# Patient Record
Sex: Female | Born: 1950 | Race: White | Hispanic: No | Marital: Married | Smoking: Never smoker
Health system: Southern US, Community
[De-identification: ages and names within clinical notes are randomized; demographics above are authoritative.]

## PROBLEM LIST (undated history)

## (undated) DIAGNOSIS — I10 Essential (primary) hypertension: Secondary | ICD-10-CM

## (undated) DIAGNOSIS — E039 Hypothyroidism, unspecified: Secondary | ICD-10-CM

## (undated) DIAGNOSIS — R000 Tachycardia, unspecified: Secondary | ICD-10-CM

## (undated) DIAGNOSIS — M8580 Other specified disorders of bone density and structure, unspecified site: Secondary | ICD-10-CM

## (undated) DIAGNOSIS — E559 Vitamin D deficiency, unspecified: Secondary | ICD-10-CM

## (undated) DIAGNOSIS — M1990 Unspecified osteoarthritis, unspecified site: Secondary | ICD-10-CM

## (undated) DIAGNOSIS — R51 Headache: Secondary | ICD-10-CM

## (undated) DIAGNOSIS — N8502 Endometrial intraepithelial neoplasia [EIN]: Secondary | ICD-10-CM

## (undated) DIAGNOSIS — I7300 Raynaud's syndrome without gangrene: Secondary | ICD-10-CM

## (undated) DIAGNOSIS — K219 Gastro-esophageal reflux disease without esophagitis: Secondary | ICD-10-CM

## (undated) DIAGNOSIS — I38 Endocarditis, valve unspecified: Secondary | ICD-10-CM

## (undated) DIAGNOSIS — N950 Postmenopausal bleeding: Secondary | ICD-10-CM

## (undated) DIAGNOSIS — B029 Zoster without complications: Secondary | ICD-10-CM

## (undated) HISTORY — PX: DILATION AND CURETTAGE OF UTERUS: SHX78

## (undated) HISTORY — DX: Postmenopausal bleeding: N95.0

## (undated) HISTORY — DX: Endometrial intraepithelial neoplasia (EIN): N85.02

## (undated) HISTORY — DX: Raynaud's syndrome without gangrene: I73.00

## (undated) HISTORY — PX: CHOLECYSTECTOMY: SHX55

## (undated) HISTORY — DX: Zoster without complications: B02.9

## (undated) HISTORY — DX: Tachycardia, unspecified: R00.0

## (undated) HISTORY — DX: Other specified disorders of bone density and structure, unspecified site: M85.80

## (undated) HISTORY — DX: Vitamin D deficiency, unspecified: E55.9

## (undated) HISTORY — PX: APPENDECTOMY: SHX54

---

## 2012-12-15 ENCOUNTER — Telehealth: Payer: Self-pay | Admitting: Gynecologic Oncology

## 2012-12-15 ENCOUNTER — Encounter: Payer: Self-pay | Admitting: Gynecologic Oncology

## 2012-12-16 ENCOUNTER — Ambulatory Visit: Payer: BC Managed Care – PPO | Attending: Gynecologic Oncology | Admitting: Gynecologic Oncology

## 2012-12-16 ENCOUNTER — Encounter: Payer: Self-pay | Admitting: Gynecologic Oncology

## 2012-12-16 VITALS — BP 126/88 | HR 68 | Temp 97.7°F | Resp 16 | Ht 63.19 in | Wt 192.6 lb

## 2012-12-16 DIAGNOSIS — N8501 Benign endometrial hyperplasia: Secondary | ICD-10-CM

## 2012-12-16 DIAGNOSIS — N8502 Endometrial intraepithelial neoplasia [EIN]: Secondary | ICD-10-CM | POA: Insufficient documentation

## 2012-12-16 NOTE — Progress Notes (Signed)
Consult Note: Gyn-Onc  Consult was requested by Dr. Lamona Curl for the evaluation of Theresa Oliver 62 y.o. female  CC:  Chief Complaint  Patient presents with  . Complex Hyperplasia    New Consult     Assessment/Plan:  [name]  is a 62 y.o.  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 Patient was counseled regarding the treatment options  A detailed discussion was held with the patient and her family with regard to to her complex atypical hyperplasia with possible endometrial cancer diagnosis.  The options for surgical management include a hysterectomy and removal of the tubes and ovaries possibly with possible removal of pelvic and para-aortic lymph nodes dependent on the frozen section findings. A minimally invasive approach including a robotic hysterectomy or laparoscopic hysterectomy have benefits including shorter hospital stay, recovery time and better wound healing. The alternative approach is an open hysterectomy. The patient has been counseled about these surgical options and the risks of surgery in general including infection, bleeding, damage to surrounding structures (including bowel, bladder, ureters, nerves or vessels), and the postoperative risks of PE/ DVT, and lymphedema. I extensively reviewed the additional risks of robotic hysterectomy including possible need for conversion to open laparotomy.  I discussed positioning during surgery of trendelenberg and risks of minor facial swelling and care we take in preoperative positioning.  After counseling and consideration of her options, she desires to proceed with robotic total hysterectomy bilateral salpingo-oophorectomy possible lymph node dissection.   She will be seen by anesthesia for preoperative clearance and discussion of postoperative pain management.  She was given the opportunity to ask questions, which were answered to her satisfaction, and she is  agreement with the above mentioned plan of care.  Theresa Oliver and her husband are aware that procedure will be performed by Dr. Rockney Ghee on 12/30/2012.   HPI: This is a 72 -year-old gravida 2 para 2 last normal menstrual period approximately 62 years of age. She reports bleeding 5-6 years ago at which time she underwent a uterine curettage and was told that there were fibroids.  When she was manic perimenopausal endometrial biopsy for abnormal bleeding revealed simple hyperplasia. Patient states she did well until 10/19/2012 vaginal spotting for one week. It started but then resumed. An endometrial biopsy was obtained on 11/17/2012 returned with evidence of complex atypical hyperplasia cannot rule out well-differentiated fight FIGO grade 1 endometrial cancer.  Pelvic ultrasound the same day demonstrates a presence of an anteverted uterus. Endometrium was noted to be thickened to 13 mm and there was a was a circumscribed area approximately 1.5 cm with a feeder vessel. An anterior fundal serosal fibroid was noted to measure 1.9 cm bilateral ovaries were seen were unremarkable. Uterus was noted to be 7.06 cm in length.  She denies any vaginal bleeding since endometrial biopsy  Current Meds:  Outpatient Encounter Prescriptions as of 12/16/2012  Medication Sig Dispense Refill  . aspirin 81 MG tablet Take 81 mg by mouth daily.      . Calcium Citrate-Vitamin D (CALCIUM CITRATE + D PO) Take by mouth.      . Cholecalciferol (VITAMIN D PO) Take by mouth.      . Flaxseed, Linseed, (FLAX SEED OIL PO) Take by mouth.      . levothyroxine (SYNTHROID, LEVOTHROID) 75 MCG tablet Take 75 mcg by mouth daily before breakfast.      . propranolol ER (INDERAL LA) 80 MG 24 hr capsule  Take 80 mg by mouth daily.       No facility-administered encounter medications on file as of 12/16/2012.    Allergy: No Known Allergies  Social Hx:   History   Social History  . Marital Status: Married    Spouse Name: N/A     Number of Children: N/A  . Years of Education: N/A   Occupational History  . Not on file.   Social History Main Topics  . Smoking status: Never Smoker   . Smokeless tobacco: Not on file  . Alcohol Use: Not on file  . Drug Use: Not on file  . Sexually Active: Not on file   Other Topics Concern  . Not on file   Social History Narrative  . No narrative on file    Past Surgical Hx:  Past Surgical History  Procedure Laterality Date  . Appendectomy    . Cholecystectomy    . Dilation and curettage of uterus      Past Medical Hx:  Past Medical History  Diagnosis Date  . Postmenopausal bleeding   . Osteopenia   . Raynaud disease   . Tachycardia   . Vitamin D deficiency   . Complex endometrial hyperplasia with atypia     Past Gynecological History: Gravida 2 para 2 menarche occurred at age of 32 and regular menses until the age of 12. Reports use of an IUD for 6 years oral contraceptive pills for over 10 years. Reports a remote history of abnormal Pap test  Family Hx:  Family History  Problem Relation Age of Onset  . Congestive Heart Failure Mother   . Uterine cancer Maternal Aunt     Review of Systems:  Constitutional  Feels well,   Skin/Breast  No rash, sores, jaundice, itching, dryness Cardiovascular  No chest pain, shortness of breath, or edema  Pulmonary  No cough or wheeze.  Gastro Intestinal  No nausea, vomitting, or diarrhoea. No bright red blood per rectum, no abdominal pain, change in bowel movement, or constipation.  Genito Urinary  No frequency, urgency, dysuria, no vaginal bleeding or discharge Musculo Skeletal  No myalgia, arthralgia, joint swelling or pain  Neurologic  No weakness, numbness, change in gait,  Psychology  No depression, mild  anxiety, no insomnia.   Vitals:  Blood pressure 126/88, pulse 68, temperature 97.7 F (36.5 C), temperature source Oral, resp. rate 16, height 5' 3.19" (1.605 m), weight 192 lb 9.6 oz (87.363  kg).  Physical Exam: WD in NAD Neck  Supple NROM, without any enlargements.  Lymph Node Survey No cervical supraclavicular or inguinal adenopathy Cardiovascular  Pulse normal rate, regularity and rhythm. S1 and S2 normal.  Lungs  Clear to auscultation bilateraly, without wheezes/crackles/rhonchi. No rash/lesions/breakdown  Psychiatry  Alert and oriented to person, place, and time  Abdomen  Normoactive bowel sounds, abdomen soft, non-tender and obese. Surgical  sites intact without evidence of hernia.  Back No CVA tenderness Genito Urinary  Vulva/vagina: Normal external female genitalia.  No lesions. No discharge or bleeding.  Bladder/urethra:  No lesions or masses  Vagina: No lesions no vaginal bleeding or discharge  Cervix: Approximately 3.5 cm Normal appearing, no lesions.  Uterus: Small, mobile, no parametrial involvement or nodularity.  Adnexa: No palpable masses. Rectal  Good tone, no masses no cul de sac nodularity.  Extremities  No bilateral cyanosis, clubbing or edema.   Laurette Schimke, MD, PhD 12/16/2012, 5:03 PM

## 2012-12-22 ENCOUNTER — Telehealth: Payer: Self-pay | Admitting: Gynecologic Oncology

## 2012-12-22 NOTE — Telephone Encounter (Signed)
Message left with upcoming surgical date and time.  Instructed that she would be receiving a phone call from pre-surgical testing to arrange an appointment.  Instructed to call the office with any questions or concerns.

## 2012-12-23 ENCOUNTER — Encounter (HOSPITAL_COMMUNITY): Payer: Self-pay | Admitting: Pharmacy Technician

## 2012-12-25 NOTE — Patient Instructions (Addendum)
22 Theresa Oliver  12/25/2012   Your procedure is scheduled on: 12/30/12  Report to [facility] at 7:15AM.  Call this number if you have problems the morning of surgery [phone]: [phone]   Remember:   Do not eat food or drink liquids After Midnight.     Take these medicines the morning of surgery with A SIP OF WATER: synthroid   Do not wear jewelry, make-up or nail polish.  Do not wear lotions, powders, or perfumes. You may wear deodorant.  Do not shave 48 hours prior to surgery. Men may shave face and neck.  Do not bring valuables to the hospital.  Contacts, dentures or bridgework may not be worn into surgery.  Leave suitcase in the car. After surgery it may be brought to your room.  For patients admitted to the hospital, checkout time is 11:00 AM the day of discharge.    Please read over the following fact sheets that you were given: MRSA Information, incentive spirometry fact sheet, blood fact sheet Birdie Sons, RN  pre op nurse call if needed [phone]    FAILURE TO FOLLOW THESE INSTRUCTIONS MAY RESULT IN CANCELLATION OF YOUR SURGERY   Patient Signature: ___________________________________________

## 2012-12-26 ENCOUNTER — Ambulatory Visit (HOSPITAL_COMMUNITY)
Admission: RE | Admit: 2012-12-26 | Discharge: 2012-12-26 | Disposition: A | Discharge status: Home / Self Care | Payer: BC Managed Care – PPO | Source: Ambulatory Visit | Attending: Gynecologic Oncology | Admitting: Gynecologic Oncology

## 2012-12-26 ENCOUNTER — Encounter (HOSPITAL_COMMUNITY): Payer: Self-pay

## 2012-12-26 ENCOUNTER — Encounter (HOSPITAL_COMMUNITY)
Admission: RE | Admit: 2012-12-26 | Discharge: 2012-12-26 | Disposition: A | Discharge status: Home / Self Care | Payer: BC Managed Care – PPO | Source: Ambulatory Visit | Attending: Gynecologic Oncology | Admitting: Gynecologic Oncology

## 2012-12-26 DIAGNOSIS — Z01818 Encounter for other preprocedural examination: Secondary | ICD-10-CM | POA: Insufficient documentation

## 2012-12-26 DIAGNOSIS — Z01810 Encounter for preprocedural cardiovascular examination: Secondary | ICD-10-CM | POA: Insufficient documentation

## 2012-12-26 DIAGNOSIS — R9431 Abnormal electrocardiogram [ECG] [EKG]: Secondary | ICD-10-CM | POA: Insufficient documentation

## 2012-12-26 HISTORY — DX: Essential (primary) hypertension: I10

## 2012-12-26 HISTORY — DX: Headache: R51

## 2012-12-26 HISTORY — DX: Endocarditis, valve unspecified: I38

## 2012-12-26 HISTORY — DX: Hypothyroidism, unspecified: E03.9

## 2012-12-26 HISTORY — DX: Unspecified osteoarthritis, unspecified site: M19.90

## 2012-12-26 HISTORY — DX: Gastro-esophageal reflux disease without esophagitis: K21.9

## 2012-12-26 LAB — CBC WITH DIFFERENTIAL/PLATELET
HCT: 42.5 % (ref 36.0–46.0)
Hemoglobin: 14.3 g/dL (ref 12.0–15.0)
Lymphocytes Relative: 36 % (ref 12–46)
Lymphs Abs: 1.7 10*3/uL (ref 0.7–4.0)
MCHC: 33.6 g/dL (ref 30.0–36.0)
Monocytes Absolute: 0.5 10*3/uL (ref 0.1–1.0)
Monocytes Relative: 10 % (ref 3–12)
Neutro Abs: 2.4 10*3/uL (ref 1.7–7.7)
RBC: 4.88 MIL/uL (ref 3.87–5.11)
WBC: 4.6 10*3/uL (ref 4.0–10.5)

## 2012-12-26 LAB — COMPREHENSIVE METABOLIC PANEL
Alkaline Phosphatase: 77 U/L (ref 39–117)
BUN: 13 mg/dL (ref 6–23)
CO2: 31 mEq/L (ref 19–32)
Chloride: 105 mEq/L (ref 96–112)
Creatinine, Ser: 0.87 mg/dL (ref 0.50–1.10)
GFR calc non Af Amer: 72 mL/min — ABNORMAL LOW (ref >90)
Glucose, Bld: 110 mg/dL — ABNORMAL HIGH (ref 70–99)
Total Bilirubin: 0.4 mg/dL (ref 0.3–1.2)

## 2012-12-26 LAB — SURGICAL PCR SCREEN: Staphylococcus aureus: NEGATIVE

## 2012-12-29 ENCOUNTER — Telehealth: Payer: Self-pay | Admitting: Gynecologic Oncology

## 2012-12-29 NOTE — Telephone Encounter (Signed)
Telephone call to check on pre-operative status.  Message left.  Reinforced NPO after midnight.  Instructed to call for any needs. 

## 2012-12-30 ENCOUNTER — Encounter (HOSPITAL_COMMUNITY): Payer: Self-pay

## 2012-12-30 ENCOUNTER — Encounter (HOSPITAL_COMMUNITY): Payer: Self-pay | Admitting: Anesthesiology

## 2012-12-30 ENCOUNTER — Observation Stay (HOSPITAL_COMMUNITY)
Admission: RE | Admit: 2012-12-30 | Discharge: 2012-12-31 | Disposition: A | Discharge status: Home / Self Care | Payer: BC Managed Care – PPO | Source: Ambulatory Visit | Attending: Obstetrics & Gynecology | Admitting: Obstetrics & Gynecology

## 2012-12-30 ENCOUNTER — Encounter (HOSPITAL_COMMUNITY)
Admission: RE | Disposition: A | Discharge status: Home / Self Care | Payer: Self-pay | Source: Ambulatory Visit | Attending: Obstetrics & Gynecology

## 2012-12-30 ENCOUNTER — Ambulatory Visit (HOSPITAL_COMMUNITY): Payer: BC Managed Care – PPO | Admitting: Anesthesiology

## 2012-12-30 DIAGNOSIS — Z79899 Other long term (current) drug therapy: Secondary | ICD-10-CM | POA: Insufficient documentation

## 2012-12-30 DIAGNOSIS — C549 Malignant neoplasm of corpus uteri, unspecified: Secondary | ICD-10-CM

## 2012-12-30 DIAGNOSIS — Z7982 Long term (current) use of aspirin: Secondary | ICD-10-CM | POA: Insufficient documentation

## 2012-12-30 DIAGNOSIS — E039 Hypothyroidism, unspecified: Secondary | ICD-10-CM | POA: Insufficient documentation

## 2012-12-30 DIAGNOSIS — N8502 Endometrial intraepithelial neoplasia [EIN]: Secondary | ICD-10-CM | POA: Diagnosis present

## 2012-12-30 DIAGNOSIS — I10 Essential (primary) hypertension: Secondary | ICD-10-CM | POA: Insufficient documentation

## 2012-12-30 HISTORY — PX: ROBOTIC ASSISTED TOTAL HYSTERECTOMY WITH BILATERAL SALPINGO OOPHERECTOMY: SHX6086

## 2012-12-30 LAB — TYPE AND SCREEN
ABO/RH(D): B POS
Antibody Screen: NEGATIVE

## 2012-12-30 SURGERY — ROBOTIC ASSISTED TOTAL HYSTERECTOMY WITH BILATERAL SALPINGO OOPHORECTOMY
Anesthesia: General | Laterality: Bilateral | Wound class: Clean Contaminated

## 2012-12-30 MED ORDER — FENTANYL CITRATE 0.05 MG/ML IJ SOLN
INTRAMUSCULAR | Status: DC | PRN
  Administered 2012-12-30: 50 ug via INTRAVENOUS
  Administered 2012-12-30 (×2): 100 ug via INTRAVENOUS

## 2012-12-30 MED ORDER — PROPRANOLOL HCL ER 80 MG PO CP24
80.0000 mg | ORAL_CAPSULE | Freq: Every day | ORAL | Status: DC
  Filled 2012-12-30: qty 1

## 2012-12-30 MED ORDER — MIDAZOLAM HCL 5 MG/5ML IJ SOLN
INTRAMUSCULAR | Status: DC | PRN
  Administered 2012-12-30: 2 mg via INTRAVENOUS

## 2012-12-30 MED ORDER — STERILE WATER FOR IRRIGATION IR SOLN
Status: DC | PRN
  Administered 2012-12-30: 1500 mL

## 2012-12-30 MED ORDER — ACETAMINOPHEN 10 MG/ML IV SOLN
1000.0000 mg | Freq: Four times a day (QID) | INTRAVENOUS | Status: AC
  Administered 2012-12-30 – 2012-12-31 (×3): 1000 mg via INTRAVENOUS
  Filled 2012-12-30 (×4): qty 100

## 2012-12-30 MED ORDER — PROPOFOL 10 MG/ML IV BOLUS
INTRAVENOUS | Status: DC | PRN
  Administered 2012-12-30: 150 mg via INTRAVENOUS

## 2012-12-30 MED ORDER — GLYCOPYRROLATE 0.2 MG/ML IJ SOLN
INTRAMUSCULAR | Status: DC | PRN
  Administered 2012-12-30: 0.6 mg via INTRAVENOUS

## 2012-12-30 MED ORDER — OXYCODONE-ACETAMINOPHEN 5-325 MG PO TABS
1.0000 | ORAL_TABLET | ORAL | Status: DC | PRN
  Administered 2012-12-30: 1 via ORAL
  Filled 2012-12-30: qty 1

## 2012-12-30 MED ORDER — ACETAMINOPHEN 10 MG/ML IV SOLN
INTRAVENOUS | Status: DC | PRN
  Administered 2012-12-30: 1000 mg via INTRAVENOUS

## 2012-12-30 MED ORDER — FENTANYL CITRATE 0.05 MG/ML IJ SOLN
25.0000 ug | INTRAMUSCULAR | Status: DC | PRN
  Administered 2012-12-30 (×2): 25 ug via INTRAVENOUS

## 2012-12-30 MED ORDER — ONDANSETRON HCL 4 MG/2ML IJ SOLN
INTRAMUSCULAR | Status: DC | PRN
  Administered 2012-12-30: 4 mg via INTRAVENOUS

## 2012-12-30 MED ORDER — ROCURONIUM BROMIDE 100 MG/10ML IV SOLN
INTRAVENOUS | Status: DC | PRN
  Administered 2012-12-30: 50 mg via INTRAVENOUS

## 2012-12-30 MED ORDER — LEVOTHYROXINE SODIUM 75 MCG PO TABS
75.0000 ug | ORAL_TABLET | Freq: Every day | ORAL | Status: DC
  Administered 2012-12-31: 75 ug via ORAL
  Filled 2012-12-30 (×2): qty 1

## 2012-12-30 MED ORDER — LACTATED RINGERS IR SOLN
Status: DC | PRN
  Administered 2012-12-30: 100 mL

## 2012-12-30 MED ORDER — NEOSTIGMINE METHYLSULFATE 1 MG/ML IJ SOLN
INTRAMUSCULAR | Status: DC | PRN
  Administered 2012-12-30: 4 mg via INTRAVENOUS

## 2012-12-30 MED ORDER — ACETAMINOPHEN 10 MG/ML IV SOLN
INTRAVENOUS | Status: AC
  Filled 2012-12-30: qty 100

## 2012-12-30 MED ORDER — CEFAZOLIN SODIUM 1-5 GM-% IV SOLN
INTRAVENOUS | Status: AC
  Filled 2012-12-30: qty 100

## 2012-12-30 MED ORDER — ONDANSETRON HCL 4 MG/2ML IJ SOLN: 4.0000 mg | Freq: Four times a day (QID) | INTRAMUSCULAR | Status: DC | PRN

## 2012-12-30 MED ORDER — CEFAZOLIN SODIUM-DEXTROSE 2-3 GM-% IV SOLR
2.0000 g | INTRAVENOUS | Status: AC
Start: 2012-12-30 — End: 2012-12-30
  Administered 2012-12-30: 2 g via INTRAVENOUS

## 2012-12-30 MED ORDER — KCL IN DEXTROSE-NACL 20-5-0.45 MEQ/L-%-% IV SOLN
INTRAVENOUS | Status: DC
  Administered 2012-12-30 – 2012-12-31 (×2): via INTRAVENOUS
  Filled 2012-12-30 (×5): qty 1000

## 2012-12-30 MED ORDER — ONDANSETRON HCL 4 MG PO TABS: 4.0000 mg | ORAL_TABLET | Freq: Four times a day (QID) | ORAL | Status: DC | PRN

## 2012-12-30 MED ORDER — LACTATED RINGERS IV SOLN
INTRAVENOUS | Status: DC | PRN
  Administered 2012-12-30 (×2): via INTRAVENOUS

## 2012-12-30 MED ORDER — FENTANYL CITRATE 0.05 MG/ML IJ SOLN
INTRAMUSCULAR | Status: AC
  Filled 2012-12-30: qty 2

## 2012-12-30 MED ORDER — MEPERIDINE HCL 50 MG/ML IJ SOLN: 6.2500 mg | INTRAMUSCULAR | Status: DC | PRN

## 2012-12-30 MED ORDER — ZOLPIDEM TARTRATE 5 MG PO TABS: 5.0000 mg | ORAL_TABLET | Freq: Every evening | ORAL | Status: DC | PRN

## 2012-12-30 MED ORDER — LIDOCAINE HCL (CARDIAC) 20 MG/ML IV SOLN
INTRAVENOUS | Status: DC | PRN
  Administered 2012-12-30: 60 mg via INTRAVENOUS

## 2012-12-30 MED ORDER — PROMETHAZINE HCL 25 MG/ML IJ SOLN: 6.2500 mg | INTRAMUSCULAR | Status: DC | PRN

## 2012-12-30 MED ORDER — LACTATED RINGERS IV SOLN: INTRAVENOUS | Status: DC

## 2012-12-30 MED ORDER — HYDROMORPHONE HCL PF 1 MG/ML IJ SOLN: 0.5000 mg | INTRAMUSCULAR | Status: DC | PRN

## 2012-12-30 SURGICAL SUPPLY — 50 items
BENZOIN TINCTURE PRP APPL 2/3 (GAUZE/BANDAGES/DRESSINGS) ×2 IMPLANT
CHLORAPREP W/TINT 26ML (MISCELLANEOUS) ×4 IMPLANT
CLOTH BEACON ORANGE TIMEOUT ST (SAFETY) ×2 IMPLANT
CORD HIGH FREQUENCY UNIPOLAR (ELECTROSURGICAL) ×2 IMPLANT
CORDS BIPOLAR (ELECTRODE) ×2 IMPLANT
COVER MAYO STAND STRL (DRAPES) ×2 IMPLANT
COVER SURGICAL LIGHT HANDLE (MISCELLANEOUS) ×2 IMPLANT
COVER TIP SHEARS 8 DVNC (MISCELLANEOUS) ×1 IMPLANT
COVER TIP SHEARS 8MM DA VINCI (MISCELLANEOUS) ×1
DRAPE LG THREE QUARTER DISP (DRAPES) ×4 IMPLANT
DRAPE SURG IRRIG POUCH 19X23 (DRAPES) ×2 IMPLANT
DRAPE TABLE BACK 44X90 PK DISP (DRAPES) ×4 IMPLANT
DRAPE UTILITY XL STRL (DRAPES) ×2 IMPLANT
DRAPE WARM FLUID 44X44 (DRAPE) ×2 IMPLANT
DRSG TEGADERM 6X8 (GAUZE/BANDAGES/DRESSINGS) ×4 IMPLANT
ELECT REM PT RETURN 9FT ADLT (ELECTROSURGICAL) ×2
ELECTRODE REM PT RTRN 9FT ADLT (ELECTROSURGICAL) ×1 IMPLANT
GLOVE BIO SURGEON STRL SZ 6.5 (GLOVE) ×4 IMPLANT
GLOVE BIO SURGEON STRL SZ7.5 (GLOVE) IMPLANT
GLOVE BIOGEL PI IND STRL 7.0 (GLOVE) ×3 IMPLANT
GLOVE BIOGEL PI INDICATOR 7.0 (GLOVE) ×3
GOWN PREVENTION PLUS XLARGE (GOWN DISPOSABLE) IMPLANT
GOWN STRL NON-REIN LRG LVL3 (GOWN DISPOSABLE) ×6 IMPLANT
HOLDER FOLEY CATH W/STRAP (MISCELLANEOUS) ×2 IMPLANT
KIT ACCESSORY DA VINCI DISP (KITS) ×1
KIT ACCESSORY DVNC DISP (KITS) ×1 IMPLANT
MANIPULATOR UTERINE 4.5 ZUMI (MISCELLANEOUS) ×2 IMPLANT
OCCLUDER COLPOPNEUMO (BALLOONS) ×2 IMPLANT
PACK LAPAROSCOPY W LONG (CUSTOM PROCEDURE TRAY) ×2 IMPLANT
POUCH SPECIMEN RETRIEVAL 10MM (ENDOMECHANICALS) ×4 IMPLANT
SCISSORS LAP 5X45 EPIX DISP (ENDOMECHANICALS) ×2 IMPLANT
SET TUBE IRRIG SUCTION NO TIP (IRRIGATION / IRRIGATOR) ×2 IMPLANT
SHEET LAVH (DRAPES) ×2 IMPLANT
SOLUTION ELECTROLUBE (MISCELLANEOUS) ×2 IMPLANT
SPONGE LAP 18X18 X RAY DECT (DISPOSABLE) IMPLANT
STRIP CLOSURE SKIN 1/2X4 (GAUZE/BANDAGES/DRESSINGS) ×2 IMPLANT
SUT VIC AB 0 CT1 27 (SUTURE) ×3
SUT VIC AB 0 CT1 27XBRD ANTBC (SUTURE) ×3 IMPLANT
SUT VIC AB 4-0 PS2 27 (SUTURE) ×4 IMPLANT
SUT VICRYL 0 UR6 27IN ABS (SUTURE) ×2 IMPLANT
SYR 50ML LL SCALE MARK (SYRINGE) ×2 IMPLANT
SYR BULB IRRIGATION 50ML (SYRINGE) IMPLANT
TOWEL OR 17X26 10 PK STRL BLUE (TOWEL DISPOSABLE) ×4 IMPLANT
TRAP SPECIMEN MUCOUS 40CC (MISCELLANEOUS) IMPLANT
TRAY FOLEY CATH 14FRSI W/METER (CATHETERS) ×2 IMPLANT
TROCAR 12M 150ML BLUNT (TROCAR) ×2 IMPLANT
TROCAR XCEL 12X100 BLDLESS (ENDOMECHANICALS) ×2 IMPLANT
TROCAR XCEL BLADELESS 5X75MML (TROCAR) ×2 IMPLANT
TROCAR XCEL NON-BLD 5MMX100MML (ENDOMECHANICALS) ×2 IMPLANT
WATER STERILE IRR 1500ML POUR (IV SOLUTION) ×4 IMPLANT

## 2012-12-30 NOTE — Transfer of Care (Signed)
Immediate Anesthesia Transfer of Care Note  Patient: Theresa Oliver  Procedure(s) Performed: Procedure(s): ROBOTIC ASSISTED TOTAL HYSTERECTOMY WITH BILATERAL SALPINGO OOPHORECTOMY,right pelvic lymphnode dissection (Bilateral)  Patient Location: PACU  Anesthesia Type:General  Level of Consciousness: awake, alert , oriented and patient cooperative  Airway & Oxygen Therapy: Patient Spontanous Breathing and Patient connected to face mask oxygen  Post-op Assessment: Report given to PACU RN and Post -op Vital signs reviewed and stable  Post vital signs: Reviewed and stable  Complications: No apparent anesthesia complications

## 2012-12-30 NOTE — Anesthesia Preprocedure Evaluation (Addendum)
Anesthesia Evaluation  Patient identified by MRN, date of birth, ID band Patient awake    Reviewed: Allergy & Precautions, H&P , NPO status , Patient's Chart, lab work & pertinent test results  Airway Mallampati: II TM Distance: >3 FB Neck ROM: Full    Dental no notable dental hx.    Pulmonary neg pulmonary ROS,  breath sounds clear to auscultation  Pulmonary exam normal       Cardiovascular hypertension, Pt. on medications negative cardio ROS  Rhythm:Regular Rate:Normal     Neuro/Psych negative neurological ROS  negative psych ROS   GI/Hepatic negative GI ROS, Neg liver ROS,   Endo/Other  negative endocrine ROSHypothyroidism   Renal/GU negative Renal ROS  negative genitourinary   Musculoskeletal negative musculoskeletal ROS (+)   Abdominal   Peds negative pediatric ROS (+)  Hematology negative hematology ROS (+)   Anesthesia Other Findings   Reproductive/Obstetrics negative OB ROS                          Anesthesia Physical Anesthesia Plan  ASA: II  Anesthesia Plan: General   Post-op Pain Management:    Induction: Intravenous  Airway Management Planned: Oral ETT  Additional Equipment:   Intra-op Plan:   Post-operative Plan: Extubation in OR  Informed Consent: I have reviewed the patients History and Physical, chart, labs and discussed the procedure including the risks, benefits and alternatives for the proposed anesthesia with the patient or authorized representative who has indicated his/her understanding and acceptance.   Dental advisory given  Plan Discussed with: CRNA  Anesthesia Plan Comments:         Anesthesia Quick Evaluation

## 2012-12-30 NOTE — H&P (View-Only) (Signed)
Consult Note: Gyn-Onc  Consult was requested by Dr. Lamona Curl for the evaluation of Theresa Oliver 62 y.o. female  CC:  Chief Complaint  Patient presents with  . Complex Hyperplasia    New Consult     Assessment/Plan:  [name]  is a 62 y.o.  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 Patient was counseled regarding the treatment options  A detailed discussion was held with the patient and her family with regard to to her complex atypical hyperplasia with possible endometrial cancer diagnosis.  The options for surgical management include a hysterectomy and removal of the tubes and ovaries possibly with possible removal of pelvic and para-aortic lymph nodes dependent on the frozen section findings. A minimally invasive approach including a robotic hysterectomy or laparoscopic hysterectomy have benefits including shorter hospital stay, recovery time and better wound healing. The alternative approach is an open hysterectomy. The patient has been counseled about these surgical options and the risks of surgery in general including infection, bleeding, damage to surrounding structures (including bowel, bladder, ureters, nerves or vessels), and the postoperative risks of PE/ DVT, and lymphedema. I extensively reviewed the additional risks of robotic hysterectomy including possible need for conversion to open laparotomy.  I discussed positioning during surgery of trendelenberg and risks of minor facial swelling and care we take in preoperative positioning.  After counseling and consideration of her options, she desires to proceed with robotic total hysterectomy bilateral salpingo-oophorectomy possible lymph node dissection.   She will be seen by anesthesia for preoperative clearance and discussion of postoperative pain management.  She was given the opportunity to ask questions, which were answered to her satisfaction, and she is  agreement with the above mentioned plan of care.  Theresa Oliver and her husband are aware that procedure will be performed by Dr. Rockney Ghee on 12/30/2012.   HPI: This is a 44 -year-old gravida 2 para 2 last normal menstrual period approximately 62 years of age. She reports bleeding 5-6 years ago at which time she underwent a uterine curettage and was told that there were fibroids.  When she was manic perimenopausal endometrial biopsy for abnormal bleeding revealed simple hyperplasia. Patient states she did well until 10/19/2012 vaginal spotting for one week. It started but then resumed. An endometrial biopsy was obtained on 11/17/2012 returned with evidence of complex atypical hyperplasia cannot rule out well-differentiated fight FIGO grade 1 endometrial cancer.  Pelvic ultrasound the same day demonstrates a presence of an anteverted uterus. Endometrium was noted to be thickened to 13 mm and there was a was a circumscribed area approximately 1.5 cm with a feeder vessel. An anterior fundal serosal fibroid was noted to measure 1.9 cm bilateral ovaries were seen were unremarkable. Uterus was noted to be 7.06 cm in length.  She denies any vaginal bleeding since endometrial biopsy  Current Meds:  Outpatient Encounter Prescriptions as of 12/16/2012  Medication Sig Dispense Refill  . aspirin 81 MG tablet Take 81 mg by mouth daily.      . Calcium Citrate-Vitamin D (CALCIUM CITRATE + D PO) Take by mouth.      . Cholecalciferol (VITAMIN D PO) Take by mouth.      . Flaxseed, Linseed, (FLAX SEED OIL PO) Take by mouth.      . levothyroxine (SYNTHROID, LEVOTHROID) 75 MCG tablet Take 75 mcg by mouth daily before breakfast.      . propranolol ER (INDERAL LA) 80 MG 24 hr capsule  Take 80 mg by mouth daily.       No facility-administered encounter medications on file as of 12/16/2012.    Allergy: No Known Allergies  Social Hx:   History   Social History  . Marital Status: Married    Spouse Name: N/A     Number of Children: N/A  . Years of Education: N/A   Occupational History  . Not on file.   Social History Main Topics  . Smoking status: Never Smoker   . Smokeless tobacco: Not on file  . Alcohol Use: Not on file  . Drug Use: Not on file  . Sexually Active: Not on file   Other Topics Concern  . Not on file   Social History Narrative  . No narrative on file    Past Surgical Hx:  Past Surgical History  Procedure Laterality Date  . Appendectomy    . Cholecystectomy    . Dilation and curettage of uterus      Past Medical Hx:  Past Medical History  Diagnosis Date  . Postmenopausal bleeding   . Osteopenia   . Raynaud disease   . Tachycardia   . Vitamin D deficiency   . Complex endometrial hyperplasia with atypia     Past Gynecological History: Gravida 2 para 2 menarche occurred at age of 76 and regular menses until the age of 5. Reports use of an IUD for 6 years oral contraceptive pills for over 10 years. Reports a remote history of abnormal Pap test  Family Hx:  Family History  Problem Relation Age of Onset  . Congestive Heart Failure Mother   . Uterine cancer Maternal Aunt     Review of Systems:  Constitutional  Feels well,   Skin/Breast  No rash, sores, jaundice, itching, dryness Cardiovascular  No chest pain, shortness of breath, or edema  Pulmonary  No cough or wheeze.  Gastro Intestinal  No nausea, vomitting, or diarrhoea. No bright red blood per rectum, no abdominal pain, change in bowel movement, or constipation.  Genito Urinary  No frequency, urgency, dysuria, no vaginal bleeding or discharge Musculo Skeletal  No myalgia, arthralgia, joint swelling or pain  Neurologic  No weakness, numbness, change in gait,  Psychology  No depression, mild  anxiety, no insomnia.   Vitals:  Blood pressure 126/88, pulse 68, temperature 97.7 F (36.5 C), temperature source Oral, resp. rate 16, height 5' 3.19" (1.605 m), weight 192 lb 9.6 oz (87.363  kg).  Physical Exam: WD in NAD Neck  Supple NROM, without any enlargements.  Lymph Node Survey No cervical supraclavicular or inguinal adenopathy Cardiovascular  Pulse normal rate, regularity and rhythm. S1 and S2 normal.  Lungs  Clear to auscultation bilateraly, without wheezes/crackles/rhonchi. No rash/lesions/breakdown  Psychiatry  Alert and oriented to person, place, and time  Abdomen  Normoactive bowel sounds, abdomen soft, non-tender and obese. Surgical  sites intact without evidence of hernia.  Back No CVA tenderness Genito Urinary  Vulva/vagina: Normal external female genitalia.  No lesions. No discharge or bleeding.  Bladder/urethra:  No lesions or masses  Vagina: No lesions no vaginal bleeding or discharge  Cervix: Approximately 3.5 cm Normal appearing, no lesions.  Uterus: Small, mobile, no parametrial involvement or nodularity.  Adnexa: No palpable masses. Rectal  Good tone, no masses no cul de sac nodularity.  Extremities  No bilateral cyanosis, clubbing or edema.   Laurette Schimke, MD, PhD 12/16/2012, 5:03 PM

## 2012-12-30 NOTE — Anesthesia Postprocedure Evaluation (Signed)
  Anesthesia Post-op Note  Patient: Theresa Oliver  Procedure(s) Performed: Procedure(s) (LRB): ROBOTIC ASSISTED TOTAL HYSTERECTOMY WITH BILATERAL SALPINGO OOPHORECTOMY,right pelvic lymphnode dissection (Bilateral)  Patient Location: PACU  Anesthesia Type: General  Level of Consciousness: awake and alert   Airway and Oxygen Therapy: Patient Spontanous Breathing  Post-op Pain: mild  Post-op Assessment: Post-op Vital signs reviewed, Patient's Cardiovascular Status Stable, Respiratory Function Stable, Patent Airway and No signs of Nausea or vomiting  Last Vitals:  Filed Vitals:   12/30/12 1315  BP: 103/52  Pulse:   Temp: 36.4 C  Resp:     Post-op Vital Signs: stable   Complications: No apparent anesthesia complications

## 2012-12-30 NOTE — Op Note (Signed)
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   Postoperative Diagnosis: same.   Surgery: Total robotic hysterectomy bilateral salpingo-oophorectomy, right pelvic lymph node dissection.   Surgeons:  Bernita Buffy. Duard Brady, MD; Antionette Char, MD   Anesthesia: General   Anesthesiologist: Willa Frater  Estimated blood loss: 20  ml   IVF: 1800 ml   Urine output:  400 ml   Complications: None   Pathology: Uterus, cervix, bilateral tubes and ovaries, right pelvic lymph nodes to pathology.   Operative findings: Normal appearing uterus, adhesions of omentum to right pelvic side wall, rectosigmoid colon adherent to left pelvic sidewall and left adnexal. Frozen section consistent with a focus of endometrial cancer, grade 1 with minimal myometrial invasion.  Procedure: The patient was identified in the preoperative holding area. Informed consent was signed on the chart. Patient was seen history was reviewed and exam was performed.   The patient was then taken to the operating room and placed in the supine position with SCD hose on. She was then placed in the dorsolithotomy position. Her arms were tucked at her side with appropriate precautions on the gel pad. General anesthesia was then induced without difficulty. Shoulder blocks were then placed in the usual fashion with appropriate precautions. A OG-tube was placed to suction. First timeout was performed to confirm the patient, procedure, antibiotic, allergy status, estimated blood loss and OR time. The perineum was then prepped in the usual fashion with Betadine. A 14 French Foley was inserted into the bladder under sterile conditions. A sterile speculum was placed in the vagina. The cervix was without lesions. The cervix was grasped with a single-tooth tenaculum. The dilator without difficulty. A ZUMI with a medium  Koe ring was placed without difficulty. The abdomen was then prepped with 2 Chlor prep sponges per protocol.   Patient was then draped after the prep was dried. Second timeout was performed to confirm the above. After again confirming OG tube placement and it was to suction. A stab-wound was made in left upper quadrant 2 cm below the costal margin on the left in the midclavicular line. A 5 mm operative report was used to assure intra-abdominal placement. The abdomen was insufflated. At this point all points during the procedure the patient's intra-abdominal pressure was not increased over 15 mm of mercury. After insufflation was complete, the patient was placed in deep Trendelenburg position. 25 cm above the pubic symphysis that area was marked the camera port. Bilateral robotic ports were marked 10 cm from the midline incision at approximately 5 angle. Under direct visualization each of the trochars was placed into the abdomen. The small bowel was folded on its mesentery to allow visualization to the pelvis. Adhesions of the omentum to the right pelvic sidewall were taken down with laparoscopic endoshears. The 5 mm LUQ port was then converted to a 10/12 port under direct visualization.  After assuring adequate visualization, the robot was then docked in the usual fashion. Under direct visualization the robotic instruments replaced.   The round ligament on the patient's right side was transected with monopolar cautery. The anterior and posterior leaves of the broad ligament were then taken down in the usual fashion. The ureter was identified on the medial leaf of the broad ligament. A window was made between the IP and the ureter. The IP was coagulated with bipolar cautery and transected. The posterior leaf of the broad ligament  was taken down to the level of the KOH ring. The bladder flap was created using meticulous dissection and pinpoint cautery. The uterine vessels were coagulated with bipolar cautery. The  uterine vessels were then transected and the C loop was created. The same procedure was performed on the patient's left side.   The pneumo-occulder in the vagina was then insufflated. The colpotomy was then created in the usual fashion. The specimen was then delivered to the vagina and sent for frozen section. The vaginal cuff was closed with a running 0 Vicryl on CT 1 suture.  At this point the frozen came back with a focus of endometrial cancer, depth pending. Therefore, our attention was then drawn to opening the paravesical space on her right side the perirectal space was also opened. The obturator nerve was identified. The nodal bundle extending over the external iliac artery down to the external iliac vein was taken down using sharp dissection and monopolar cautery. The genitofemoral nerve was identified and spared. We continued our dissection down to the level of the obturator nerve. The nodal bundle superior to the obturator nerve was taken. All pedicles were noted to be hemostatic the ureter was noted to be well medial of the area of dissection. The nodal bundle was then placed and an Endo catch bag.   At this point frozen section returned as minimal myometrial invasion of grade 1 lesion. The decision was made to stop the procedure is no further lymphadenectomy was indicated.   The abdomen and pelvis were copiously irrigated and noted to be hemostatic. The robotic instruments were removed under direct visualization as were the robotic trochars. The pneumoperitoneum was removed. The patient was then taken out of the Trendelenburg position. Using of 0 Vicryl on a UR 6 needle the midline port fascia was closed after being grasped with allis clamps. The subcutaneous tissues of the port in the left upper quadrant was reapproximated. The skin was closed using 4-0 Vicryl. Steri-Strips and benzoin were applied. The pneumo occluded balloon was removed from the vagina. The vagina was swabbed and noted to be  hemostatic.   All instrument needle and Ray-Tec counts were correct x2. The patient tolerated the procedure well and was taken to the recovery room in stable condition. This is Cleda Mccreedy dictating an operative note on patient Azaliyah Kennard.

## 2012-12-30 NOTE — Interval H&P Note (Signed)
History and Physical Interval Note:  12/30/2012 9:11 AM  Theresa Oliver  has presented today for surgery, with the diagnosis of COMPLEX HYPERPLASIA  The various methods of treatment have been discussed with the patient and family. After consideration of risks, benefits and other options for treatment, the patient has consented to  Procedure(s): ROBOTIC ASSISTED TOTAL HYSTERECTOMY WITH BILATERAL SALPINGO OOPHORECTOMY, POSSIBLE LYMPH NODE DISSECTION (Bilateral) as a surgical intervention .  The patient's history has been reviewed, patient examined, no change in status, stable for surgery.  I have reviewed the patient's chart and labs.  Questions were answered to the patient's satisfaction.     Gad Aymond A.

## 2012-12-30 NOTE — Preoperative (Signed)
Beta Blockers   Reason not to administer Beta Blockers:Not Applicable 

## 2012-12-31 ENCOUNTER — Telehealth: Payer: Self-pay | Admitting: Gynecologic Oncology

## 2012-12-31 ENCOUNTER — Encounter (HOSPITAL_COMMUNITY): Payer: Self-pay | Admitting: Gynecologic Oncology

## 2012-12-31 LAB — CBC
MCH: 28.8 pg (ref 26.0–34.0)
MCV: 87.2 fL (ref 78.0–100.0)
Platelets: 164 10*3/uL (ref 150–400)
RBC: 4.45 MIL/uL (ref 3.87–5.11)
RDW: 13.5 % (ref 11.5–15.5)

## 2012-12-31 LAB — BASIC METABOLIC PANEL
CO2: 30 mEq/L (ref 19–32)
Calcium: 8.4 mg/dL (ref 8.4–10.5)
Creatinine, Ser: 0.86 mg/dL (ref 0.50–1.10)
Glucose, Bld: 102 mg/dL — ABNORMAL HIGH (ref 70–99)

## 2012-12-31 MED ORDER — OXYCODONE-ACETAMINOPHEN 5-325 MG PO TABS: 1.0000 | ORAL_TABLET | ORAL | Status: DC | PRN

## 2012-12-31 NOTE — Discharge Summary (Signed)
Physician Discharge Summary  Patient ID: Theresa Oliver MRN: [id number] DOB/AGE: 1951/06/10 62 y.o.  Admit date: 12/30/2012 Discharge date: 12/31/2012  Admission Diagnoses: Complex endometrial hyperplasia with atypia  Discharge Diagnoses:  Principal Problem:   Complex endometrial hyperplasia with atypia  Discharged Condition:  The patient is in good condition and stable for discharge.    Hospital Course: On 12/30/2012, the patient underwent the following: Procedure(s):  ROBOTIC ASSISTED TOTAL HYSTERECTOMY WITH BILATERAL SALPINGO OOPHORECTOMY,right pelvic lymph node dissection.  The postoperative course was uneventful.  She was discharged to home on postoperative day 1 tolerating a regular diet.  Consults: None  Significant Diagnostic Studies: None  Treatments: surgery: see above  Discharge Exam: Blood pressure 106/52, pulse 64, temperature 98 F (36.7 C), temperature source Oral, resp. rate 16, height 5' 3.5" (1.613 m), weight 195 lb (88.451 kg), SpO2 97.00%. General appearance: alert, cooperative and no distress Resp: clear to auscultation bilaterally Cardio: regular rate and rhythm, S1, S2 normal, no murmur, click, rub or gallop GI: soft, non-tender; bowel sounds normal; no masses,  no organomegaly Extremities: extremities normal, atraumatic, no cyanosis or edema Incision/Wound: Lap sites x 4 with steri strips clean, dry, intact   Disposition: Home  Discharge Orders   Future Appointments Provider Department Dept Phone   01/28/2013 4:00 PM Paola A. Duard Brady, MD [facility] [phone]   Future Orders Complete By Expires     Call MD for:  difficulty breathing, headache or visual disturbances  As directed     Call MD for:  extreme fatigue  As directed     Call MD for:  hives  As directed     Call MD for:  persistant dizziness or light-headedness  As directed     Call MD for:  persistant nausea and vomiting  As directed     Call MD for:   redness, tenderness, or signs of infection (pain, swelling, redness, odor or green/yellow discharge around incision site)  As directed     Call MD for:  severe uncontrolled pain  As directed     Call MD for:  temperature >100.4  As directed     Diet - low sodium heart healthy  As directed     Driving Restrictions  As directed     Comments:      No driving for 1-2 weeks.  Do not take narcotics and drive.    Increase activity slowly  As directed     Lifting restrictions  As directed     Comments:      No lifting greater than 10 lbs.    Sexual Activity Restrictions  As directed     Comments:      No sexual activity, nothing in the vagina, for 8 weeks.        Medication List         ALEVE 220 MG tablet  Generic drug:  naproxen sodium  Take 440 mg by mouth every 8 (eight) hours as needed (For headache or cramping.).     aspirin EC 81 MG tablet  Take 81 mg by mouth at bedtime.     CALCIUM CITRATE + D PO  Take 1 tablet by mouth at bedtime.     FLAX SEED OIL PO  Take 1 tablet by mouth at bedtime.     levothyroxine 75 MCG tablet  Commonly known as:  SYNTHROID, LEVOTHROID  Take 75 mcg by mouth daily before breakfast.     oxyCODONE-acetaminophen 5-325 MG per tablet  Commonly known as:  PERCOCET/ROXICET  Take 1-2 tablets by mouth every 4 (four) hours as needed (moderate to severe pain).     polyvinyl alcohol 1.4 % ophthalmic solution  Commonly known as:  LIQUIFILM TEARS  Place 2 drops into both eyes daily as needed (For dry eyes.).     propranolol ER 80 MG 24 hr capsule  Commonly known as:  INDERAL LA  Take 80 mg by mouth at bedtime.     Vitamin D3 1000 UNITS Caps  Take 2,000 Units by mouth at bedtime.           Follow-up Information   Follow up with [facility] A., MD On 01/28/2013.   Contact information:   [address] [place] [address] [phone]       Greater than thirty minutes were spend for face to face discharge instructions and discharge  orders/summary in EPIC.   Signed: CROSS, MELISSA DEAL 12/31/2012, 8:39 AM

## 2012-12-31 NOTE — Telephone Encounter (Signed)
Patient informed of final pathology results.  No concerns voiced.  F/U appt arranged for July 30.  Instructed to call for any needs or concerns.

## 2012-12-31 NOTE — Progress Notes (Signed)
Patient discharged with RX for percocet. States understanding of discharge instructions.

## 2013-01-01 ENCOUNTER — Telehealth: Payer: Self-pay | Admitting: Gynecologic Oncology

## 2013-01-01 NOTE — Telephone Encounter (Signed)
LM with  Husband regarding pathology.She is doing well. PG

## 2013-01-28 ENCOUNTER — Encounter: Payer: Self-pay | Admitting: Gynecologic Oncology

## 2013-01-28 ENCOUNTER — Ambulatory Visit: Payer: BC Managed Care – PPO | Attending: Gynecologic Oncology | Admitting: Gynecologic Oncology

## 2013-01-28 VITALS — BP 116/72 | HR 68 | Temp 97.8°F | Resp 16 | Ht 63.19 in | Wt 192.5 lb

## 2013-01-28 DIAGNOSIS — C541 Malignant neoplasm of endometrium: Secondary | ICD-10-CM

## 2013-01-28 DIAGNOSIS — C549 Malignant neoplasm of corpus uteri, unspecified: Secondary | ICD-10-CM | POA: Insufficient documentation

## 2013-01-28 DIAGNOSIS — Z79899 Other long term (current) drug therapy: Secondary | ICD-10-CM | POA: Insufficient documentation

## 2013-01-28 DIAGNOSIS — Z9079 Acquired absence of other genital organ(s): Secondary | ICD-10-CM | POA: Insufficient documentation

## 2013-01-28 DIAGNOSIS — K219 Gastro-esophageal reflux disease without esophagitis: Secondary | ICD-10-CM | POA: Insufficient documentation

## 2013-01-28 DIAGNOSIS — Z90710 Acquired absence of both cervix and uterus: Secondary | ICD-10-CM | POA: Insufficient documentation

## 2013-01-28 DIAGNOSIS — E039 Hypothyroidism, unspecified: Secondary | ICD-10-CM | POA: Insufficient documentation

## 2013-01-28 DIAGNOSIS — I10 Essential (primary) hypertension: Secondary | ICD-10-CM | POA: Insufficient documentation

## 2013-01-28 DIAGNOSIS — I7300 Raynaud's syndrome without gangrene: Secondary | ICD-10-CM | POA: Insufficient documentation

## 2013-01-28 NOTE — Progress Notes (Signed)
Consult Note: Gyn-Onc  CC:  Chief Complaint  Patient presents with  . Endometrial Adenocarcinoma    Follow up    Assessment/Plan:  [name]  is a 62 y.o.  year old with a stage IA grade 1 endometrial carcinoma. She is not require any adjuvant therapy. She and I discussed her followup and that should be seen every 6 months. We will see her in 6 months and we will alternate visits with Dr. Lamona Curl.   HPI: This is a 4 -year-old gravida 2 para 2 last normal menstrual period approximately 62 years of age. She reports bleeding 5-6 years ago at which time she underwent a uterine curettage and was told that there were fibroids.  When she was manic perimenopausal endometrial biopsy for abnormal bleeding revealed simple hyperplasia. Patient states she did well until 10/19/2012 vaginal spotting for one week. It started but then resumed. An endometrial biopsy was obtained on 11/17/2012 returned with evidence of complex atypical hyperplasia cannot rule out well-differentiated fight FIGO grade 1 endometrial cancer.  Pelvic ultrasound the same day demonstrates a presence of an anteverted uterus. Endometrium was noted to be thickened to 13 mm and there was a was a circumscribed area approximately 1.5 cm with a feeder vessel. An anterior fundal serosal fibroid was noted to measure 1.9 cm bilateral ovaries were seen were unremarkable. Uterus was noted to be 7.06 cm in length.  She denies any vaginal bleeding since endometrial biopsy.  On 12/30/2012 showed a total robotic hysterectomy BSO and right pelvic lymph node sampling. Operative findings included a normal appearing uterus, adhesions of the omentum to the right pelvic sidewall, rectosigmoid colon adherent to the left pelvic sidewall and left adnexa. Frozen section was consistent with a focus of endometrial cancer, grade 1 with minimal myometrial invasion. She did very well in the hospital and was discharged home on postoperative day #1.  Final  pathology revealed:  It superficially invasive endometrioid adenocarcinoma grade 1 arising in a background of atypical complex hyperplasia confined within inner half of the myometrium. The cervix was negative, bilateral adnexa were negative, and zero out of two right pelvic lymph nodes were involved. The myometrial invasion was 0.3 cm with the myometrium was 2.1 cm in thickness. There was no lymphovascular space involvement.  She's overall doing quite well she's [name] apprised that she is much fatigued she has. She feels about 70%. She denies any pain she sleeping well eating well. She has some pink vaginal spotting daily. She has a change about bladder habits. She has a nausea vomiting fevers or chills.  Current Meds:  Outpatient Encounter Prescriptions as of 01/28/2013  Medication Sig Dispense Refill  . Calcium Citrate-Vitamin D (CALCIUM CITRATE + D PO) Take 1 tablet by mouth at bedtime.       . Cholecalciferol (VITAMIN D3) 1000 UNITS CAPS Take 2,000 Units by mouth at bedtime.      . Flaxseed, Linseed, (FLAX SEED OIL PO) Take 1 tablet by mouth at bedtime.       Marland Kitchen levothyroxine (SYNTHROID, LEVOTHROID) 75 MCG tablet Take 75 mcg by mouth daily before breakfast.      . oxyCODONE-acetaminophen (PERCOCET/ROXICET) 5-325 MG per tablet Take 1-2 tablets by mouth every 4 (four) hours as needed (moderate to severe pain).  45 tablet  0  . polyvinyl alcohol (LIQUIFILM TEARS) 1.4 % ophthalmic solution Place 2 drops into both eyes daily as needed (For dry eyes.).      Marland Kitchen propranolol ER (INDERAL LA) 80 MG 24  hr capsule Take 80 mg by mouth at bedtime.       Marland Kitchen aspirin EC 81 MG tablet Take 81 mg by mouth at bedtime.      . naproxen sodium (ALEVE) 220 MG tablet Take 440 mg by mouth every 8 (eight) hours as needed (For headache or cramping.).       No facility-administered encounter medications on file as of 01/28/2013.    Allergy: No Known Allergies  Social Hx:   History   Social History  . Marital Status:  Married    Spouse Name: N/A    Number of Children: N/A  . Years of Education: N/A   Occupational History  . Not on file.   Social History Main Topics  . Smoking status: Never Smoker   . Smokeless tobacco: Never Used  . Alcohol Use: No  . Drug Use: No  . Sexually Active: Not on file   Other Topics Concern  . Not on file   Social History Narrative  . No narrative on file    Past Surgical Hx:  Past Surgical History  Procedure Laterality Date  . Appendectomy    . Cholecystectomy    . Dilation and curettage of uterus    . Robotic assisted total hysterectomy with bilateral salpingo oopherectomy Bilateral 12/30/2012    Procedure: ROBOTIC ASSISTED TOTAL HYSTERECTOMY WITH BILATERAL SALPINGO OOPHORECTOMY,right pelvic lymphnode dissection;  Surgeon: Rejeana Brock A. Duard Brady, MD;  Location: [facility];  Service: Gynecology;  Laterality: Bilateral;    Past Medical Hx:  Past Medical History  Diagnosis Date  . Postmenopausal bleeding   . Osteopenia   . Raynaud disease   . Tachycardia   . Vitamin D deficiency   . Complex endometrial hyperplasia with atypia   . Heart valve problem     h/o leaking valve  . Hypertension   . Hypothyroidism   . GERD (gastroesophageal reflux disease)   . Headache(784.0)     "a light comes for before migraines"  . Arthritis     Past Gynecological History: Gravida 2 para 2 menarche occurred at age of 41 and regular menses until the age of 89. Reports use of an IUD for 6 years oral contraceptive pills for over 10 years. Reports a remote history of abnormal Pap test  Family Hx:  Family History  Problem Relation Age of Onset  . Congestive Heart Failure Mother   . Uterine cancer Maternal Aunt      Vitals:  Blood pressure 116/72, pulse 68, temperature 97.8 F (36.6 C), temperature source Oral, resp. rate 16, height 5' 3.19" (1.605 m), weight 192 lb 8 oz (87.317 kg).  Physical Exam: WD in NAD  Abdomen  Normoactive bowel sounds, abdomen soft, non-tender  and obese. Surgical  sites intact without evidence of hernia.   Vulva/vagina: Normal external female genitalia.  No lesions. No discharge or bleeding. Vaginal cuff is visualized there is well-healed. The suture line is intact. There is a minimal amount of pink discharge. Bimanual examination reveals intact vaginal cuff. There is no fluctuance or tenderness.     Thompson Caul., MD, PhD 01/28/2013, 4:28 PM

## 2013-04-26 DIAGNOSIS — B029 Zoster without complications: Secondary | ICD-10-CM

## 2013-04-26 HISTORY — DX: Zoster without complications: B02.9

## 2013-07-22 ENCOUNTER — Encounter: Payer: Self-pay | Admitting: Gynecologic Oncology

## 2013-07-22 ENCOUNTER — Ambulatory Visit: Payer: BC Managed Care – PPO | Attending: Gynecologic Oncology | Admitting: Gynecologic Oncology

## 2013-07-22 VITALS — BP 138/75 | HR 72 | Temp 97.9°F | Ht 63.19 in | Wt 187.1 lb

## 2013-07-22 DIAGNOSIS — Z9079 Acquired absence of other genital organ(s): Secondary | ICD-10-CM | POA: Insufficient documentation

## 2013-07-22 DIAGNOSIS — M899 Disorder of bone, unspecified: Secondary | ICD-10-CM | POA: Insufficient documentation

## 2013-07-22 DIAGNOSIS — M949 Disorder of cartilage, unspecified: Secondary | ICD-10-CM

## 2013-07-22 DIAGNOSIS — L905 Scar conditions and fibrosis of skin: Secondary | ICD-10-CM | POA: Insufficient documentation

## 2013-07-22 DIAGNOSIS — K219 Gastro-esophageal reflux disease without esophagitis: Secondary | ICD-10-CM | POA: Insufficient documentation

## 2013-07-22 DIAGNOSIS — Z90710 Acquired absence of both cervix and uterus: Secondary | ICD-10-CM | POA: Insufficient documentation

## 2013-07-22 DIAGNOSIS — I10 Essential (primary) hypertension: Secondary | ICD-10-CM | POA: Insufficient documentation

## 2013-07-22 DIAGNOSIS — L298 Other pruritus: Secondary | ICD-10-CM | POA: Insufficient documentation

## 2013-07-22 DIAGNOSIS — C541 Malignant neoplasm of endometrium: Secondary | ICD-10-CM

## 2013-07-22 DIAGNOSIS — I7300 Raynaud's syndrome without gangrene: Secondary | ICD-10-CM | POA: Insufficient documentation

## 2013-07-22 DIAGNOSIS — G43109 Migraine with aura, not intractable, without status migrainosus: Secondary | ICD-10-CM | POA: Insufficient documentation

## 2013-07-22 DIAGNOSIS — Z79899 Other long term (current) drug therapy: Secondary | ICD-10-CM | POA: Insufficient documentation

## 2013-07-22 DIAGNOSIS — E039 Hypothyroidism, unspecified: Secondary | ICD-10-CM | POA: Insufficient documentation

## 2013-07-22 DIAGNOSIS — E559 Vitamin D deficiency, unspecified: Secondary | ICD-10-CM | POA: Insufficient documentation

## 2013-07-22 DIAGNOSIS — N8111 Cystocele, midline: Secondary | ICD-10-CM | POA: Insufficient documentation

## 2013-07-22 DIAGNOSIS — L2989 Other pruritus: Secondary | ICD-10-CM | POA: Insufficient documentation

## 2013-07-22 DIAGNOSIS — C549 Malignant neoplasm of corpus uteri, unspecified: Secondary | ICD-10-CM | POA: Insufficient documentation

## 2013-07-22 NOTE — Patient Instructions (Signed)
Follow up with Dr. Ethelene Hal in 6 months and return to gyn oncology in 1 year.

## 2013-07-22 NOTE — Progress Notes (Signed)
Consult Note: Gyn-Onc  CC:  Chief Complaint  Patient presents with  . Endometrial Adenocarcinoma    follow up    Assessment/Plan:  [name]  is a 63 y.o.  year old with a stage IA grade 1 endometrial carcinoma. She is not require any adjuvant therapy. She and I discussed her followup and that should be seen every 6 months.  She will followup with Dr. Ethelene Hal in 6 months. She'll need a Pap smear at that time. Return to see [name] in 1 year. Precautions regarding pain and bleeding reviewed with her. She was congratulated on her weight loss.  HPI: This is a 77 -year-old gravida 2 para 2 last normal menstrual period approximately 63 years of age. She reports bleeding 5-6 years ago at which time she underwent a uterine curettage and was told that there were fibroids.  When she was manic perimenopausal endometrial biopsy for abnormal bleeding revealed simple hyperplasia. Patient states she did well until 10/19/2012 vaginal spotting for one week. It started but then resumed. An endometrial biopsy was obtained on 11/17/2012 returned with evidence of complex atypical hyperplasia cannot rule out well-differentiated fight FIGO grade 1 endometrial cancer.  Pelvic ultrasound the same day demonstrates a presence of an anteverted uterus. Endometrium was noted to be thickened to 13 mm and there was a was a circumscribed area approximately 1.5 cm with a feeder vessel. An anterior fundal serosal fibroid was noted to measure 1.9 cm bilateral ovaries were seen were unremarkable. Uterus was noted to be 7.06 cm in length.  She denies any vaginal bleeding since endometrial biopsy.  On 12/30/2012 showed a total robotic hysterectomy BSO and right pelvic lymph node sampling. Operative findings included a normal appearing uterus, adhesions of the omentum to the right pelvic sidewall, rectosigmoid colon adherent to the left pelvic sidewall and left adnexa. Frozen section was consistent with a focus of endometrial cancer,  grade 1 with minimal myometrial invasion. She did very well in the hospital and was discharged home on postoperative day #1.  Final pathology revealed:  It superficially invasive endometrioid adenocarcinoma grade 1 arising in a background of atypical complex hyperplasia confined within inner half of the myometrium. The cervix was negative, bilateral adnexa were negative, and zero out of two right pelvic lymph nodes were involved. The myometrial invasion was 0.3 cm with the myometrium was 2.1 cm in thickness. There was no lymphovascular space involvement.  I saw her for her postoperative check 01/28/2013 at which time she was doing well. She comes in today for her first surveillance visit.  Interval history: From a gynecologic perspective she's been doing quite well. In October she developed shingles. She has discomfort sleeping for approximately 2 months. She states her blisters have resolved she still has scarring left. She continues to have mild itching and burning in the areas where she had her shingles outbreak. This is her first outbreak that she's had. The weight loss his experience has been intentional. She stay she might gain some weight back as well she had shingles she was not able to exercise as much as she had been. She is due for her mammogram now. There are no new medical problems and her family.  Review of Systems:  Constitutional: Denies fever. Skin: No rash, sores, jaundice, itching, or dryness.  Cardiovascular: No chest pain, shortness of breath, or edema  Pulmonary: No cough or wheeze.  Gastro Intestinal:  No nausea, vomiting, constipation, or diarrhea reported. No bright red blood per rectum or change  in bowel movement.  Genitourinary: No frequency, urgency, or dysuria.  Denies vaginal bleeding and discharge.  Musculoskeletal: No myalgia, arthralgia, joint swelling or pain.  Neurologic: No weakness, numbness, or change in gait.  Psychology: No changes    Current Meds:   Outpatient Encounter Prescriptions as of 07/22/2013  Medication Sig  . Calcium Citrate-Vitamin D (CALCIUM CITRATE + D PO) Take 1 tablet by mouth at bedtime.   . Cholecalciferol (VITAMIN D3) 1000 UNITS CAPS Take 2,000 Units by mouth at bedtime.  . Flaxseed, Linseed, (FLAX SEED OIL PO) Take 1 tablet by mouth at bedtime.   Marland Kitchen levothyroxine (SYNTHROID, LEVOTHROID) 75 MCG tablet Take 75 mcg by mouth daily before breakfast.  . polyvinyl alcohol (LIQUIFILM TEARS) 1.4 % ophthalmic solution Place 2 drops into both eyes daily as needed (For dry eyes.).  Marland Kitchen propranolol ER (INDERAL LA) 80 MG 24 hr capsule Take 80 mg by mouth at bedtime.   Marland Kitchen aspirin EC 81 MG tablet Take 81 mg by mouth at bedtime.  . naproxen sodium (ALEVE) 220 MG tablet Take 440 mg by mouth every 8 (eight) hours as needed (For headache or cramping.).  . [DISCONTINUED] oxyCODONE-acetaminophen (PERCOCET/ROXICET) 5-325 MG per tablet Take 1-2 tablets by mouth every 4 (four) hours as needed (moderate to severe pain).    Allergy: No Known Allergies  Social Hx:   History   Social History  . Marital Status: Married    Spouse Name: N/A    Number of Children: N/A  . Years of Education: N/A   Occupational History  . Not on file.   Social History Main Topics  . Smoking status: Never Smoker   . Smokeless tobacco: Never Used  . Alcohol Use: No  . Drug Use: No  . Sexual Activity: Not on file   Other Topics Concern  . Not on file   Social History Narrative  . No narrative on file    Past Surgical Hx:  Past Surgical History  Procedure Laterality Date  . Appendectomy    . Cholecystectomy    . Dilation and curettage of uterus    . Robotic assisted total hysterectomy with bilateral salpingo oopherectomy Bilateral 12/30/2012    Procedure: ROBOTIC ASSISTED TOTAL HYSTERECTOMY WITH BILATERAL SALPINGO OOPHORECTOMY,right pelvic lymphnode dissection;  Surgeon: Imagene Gurney A. Alycia Rossetti, MD;  Location: [facility];  Service: Gynecology;  Laterality: Bilateral;     Past Medical Hx:  Past Medical History  Diagnosis Date  . Postmenopausal bleeding   . Osteopenia   . Raynaud disease   . Tachycardia   . Vitamin D deficiency   . Complex endometrial hyperplasia with atypia   . Heart valve problem     h/o leaking valve  . Hypertension   . Hypothyroidism   . GERD (gastroesophageal reflux disease)   . Headache(784.0)     "a light comes for 13min before migraines"  . Arthritis   . Shingles Oct 26,2014    Back pain started 10/26, Blisters following wednesday    Past Gynecological History: Gravida 2 para 2 menarche occurred at age of 68 and regular menses until the age of 76. Reports use of an IUD for 6 years oral contraceptive pills for over 10 years. Reports a remote history of abnormal Pap test  Family Hx:  Family History  Problem Relation Age of Onset  . Congestive Heart Failure Mother   . Uterine cancer Maternal Aunt      Vitals:  Blood pressure 138/75, pulse 72, temperature 97.9 F (36.6 C), temperature  source Oral, height 5' 3.19" (1.605 m), weight 187 lb 1.6 oz (84.868 kg).  Physical Exam: WD in NAD  Neck: Supple, no lymphadenopathy no thyromegaly.  Lungs: Clear to auscultation bilaterally.  Cardiovascular: Regular rate and rhythm.  Abdomen: Normoactive bowel sounds, abdomen soft, non-tender and obese. Surgical  sites intact without evidence of hernia. Positive scarring from shingles in the left lower quadrant.  Groins: No lymphadenopathy.  Extremities: No edema.  Vulva/vagina: Normal external female genitalia.  No lesions. No discharge or bleeding. Vaginal cuff is visualized there is well-healed. There were no visible lesions. She has some lateral wall to send this and laxity. She is a grade 1 cystocele. There is no rectocele. Rectal confirms.     Ameenah Prosser A., MD 07/22/2013, 10:37 AM

## 2014-02-08 IMAGING — CR DG CHEST 2V
2 series · 2 of 2 positions shown · non-contrast
Comparison: None.

CLINICAL DATA: Preop.

CHEST - 2 VIEW

[w chest pa]
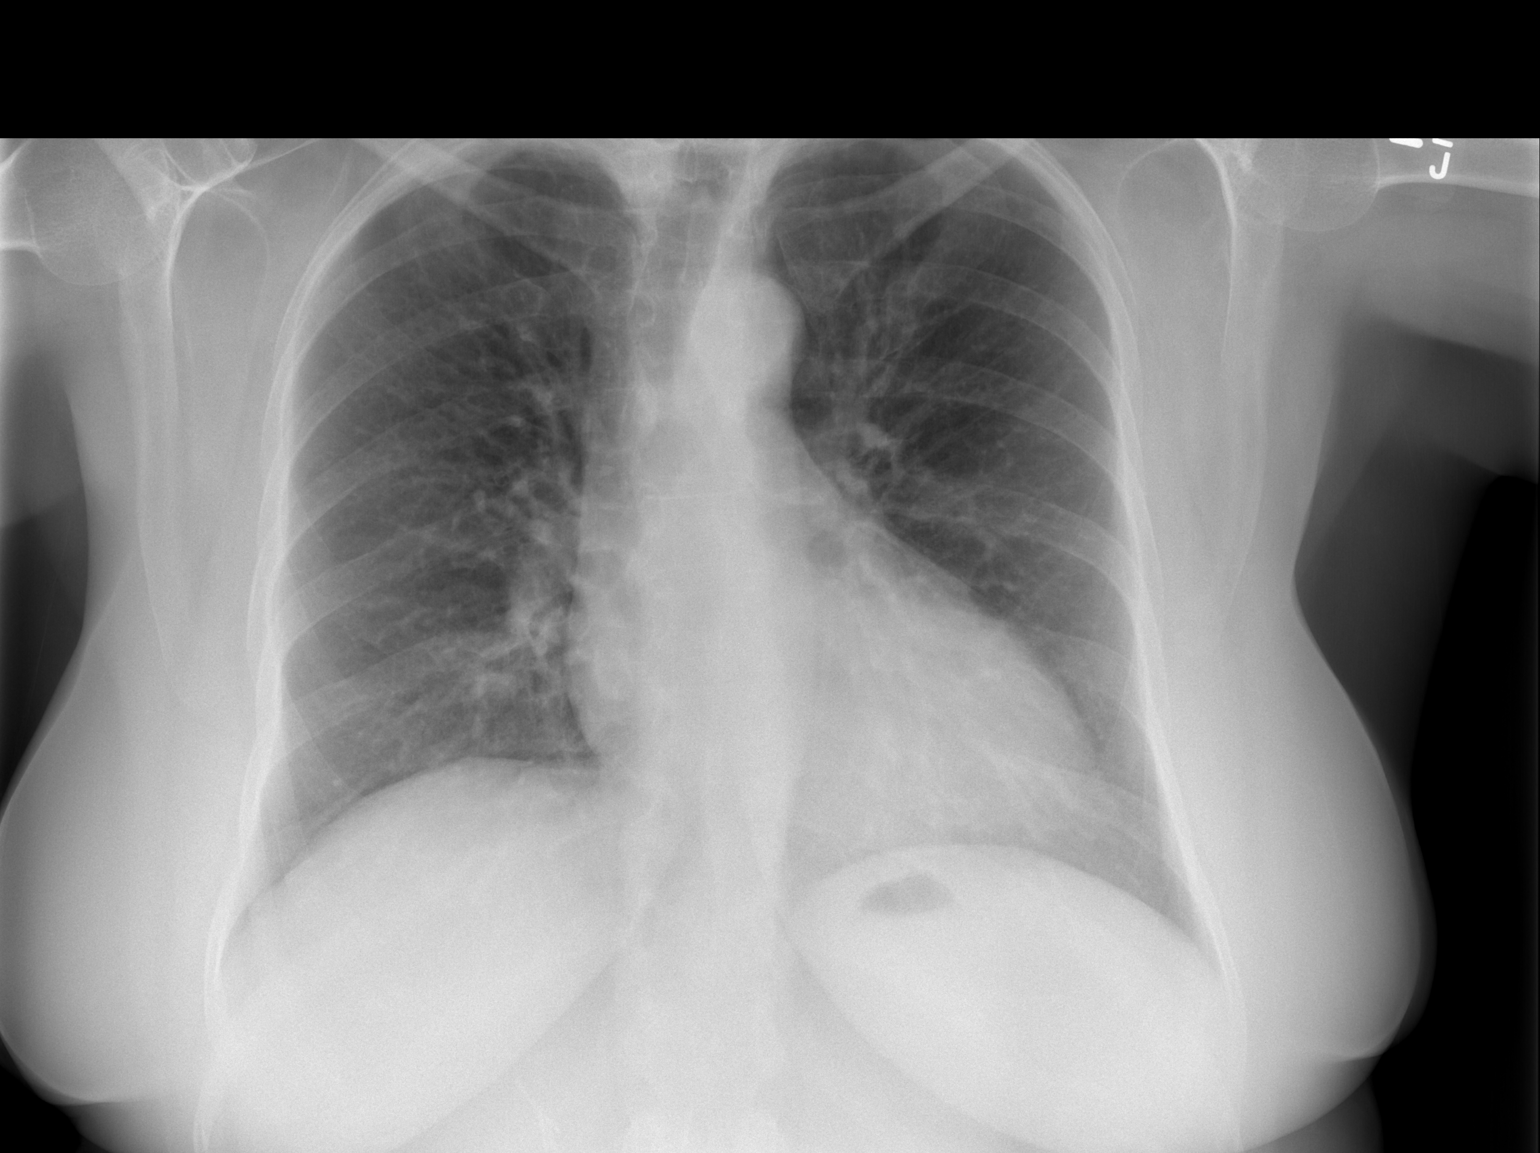

[w chest lat]
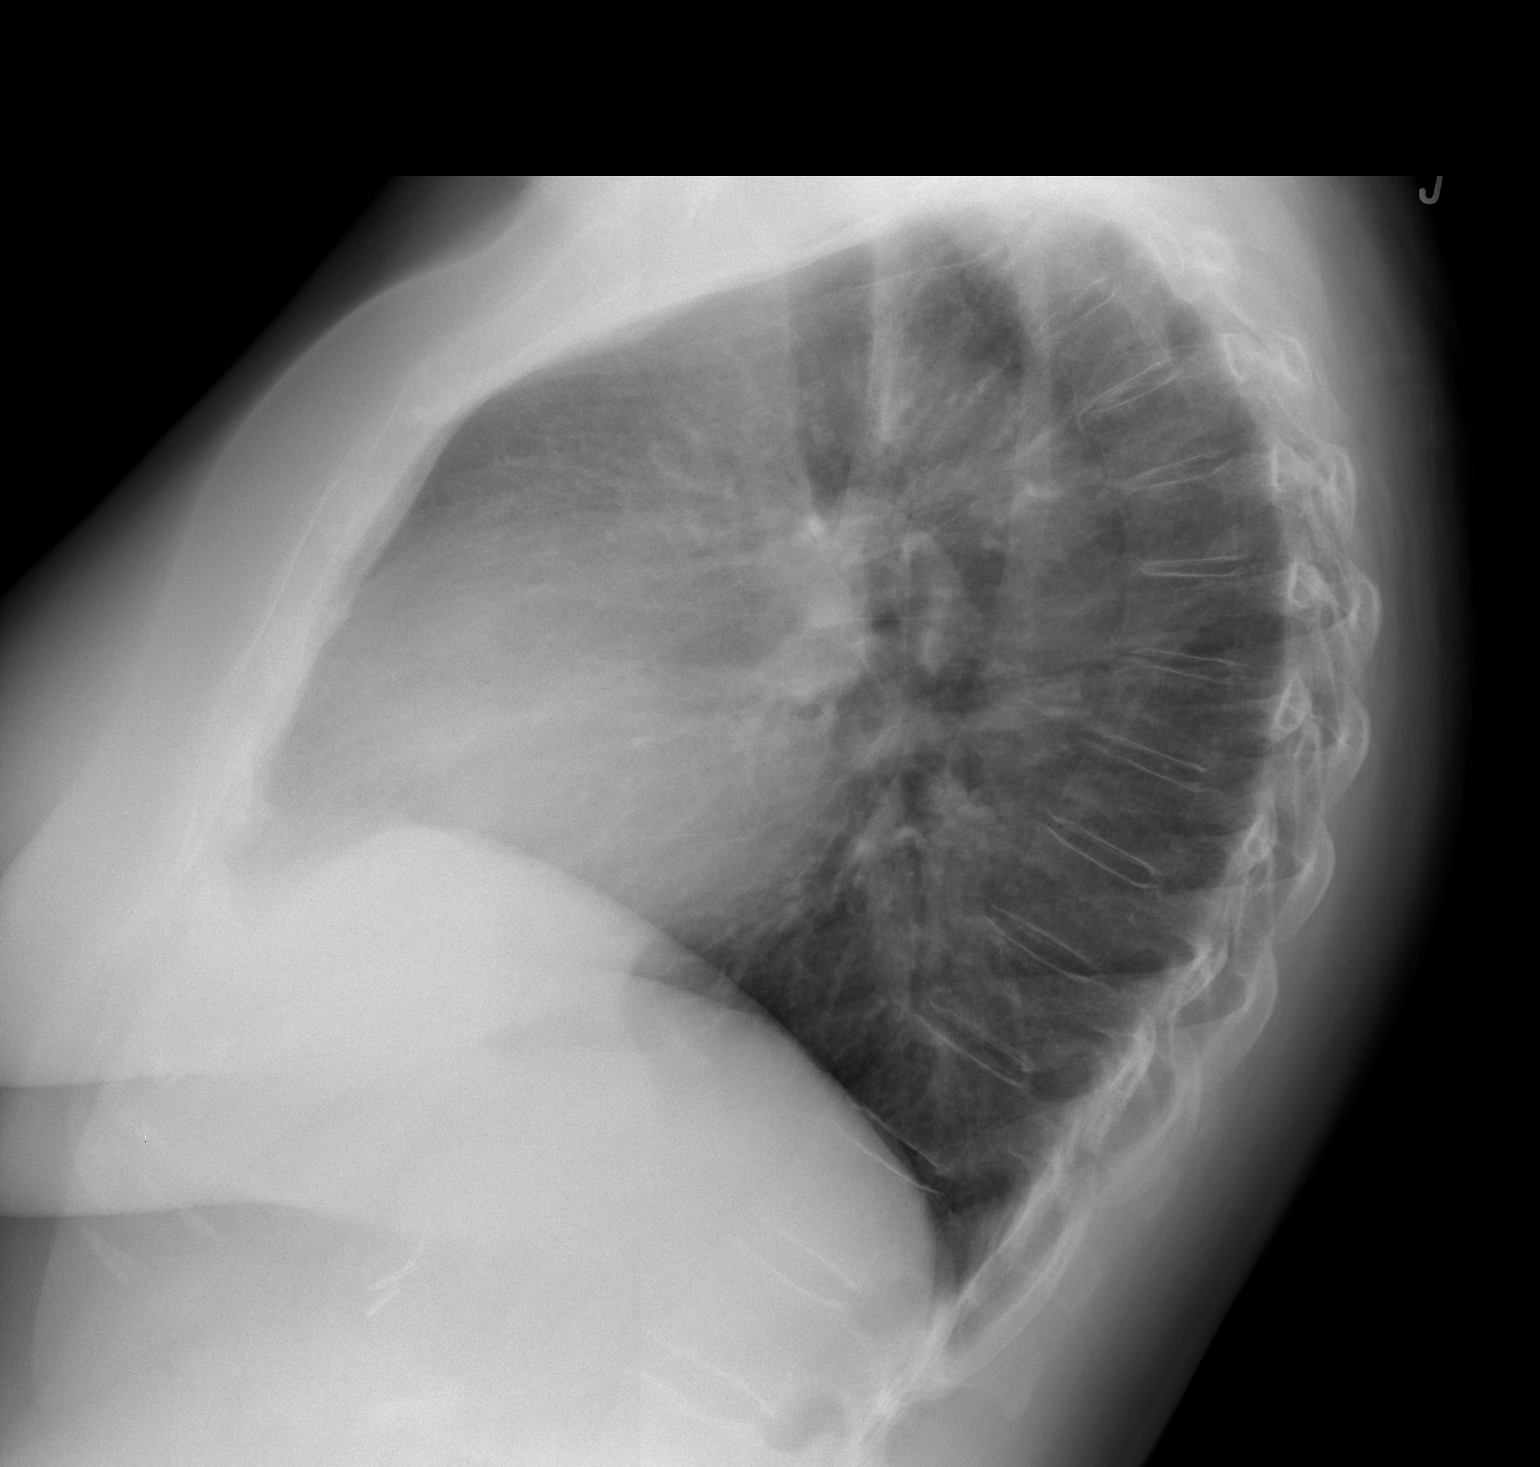

[2 of 2 positions shown; findings below may reference images not displayed]

FINDINGS: Trachea is midline.  Heart size normal. Minimal biapical
pleural parenchymal scarring.  Lungs are otherwise clear.  No
pleural fluid.
IMPRESSION: No acute findings.

## 2014-07-29 ENCOUNTER — Encounter: Payer: Self-pay | Admitting: Gynecologic Oncology

## 2014-07-29 ENCOUNTER — Ambulatory Visit: Payer: BLUE CROSS/BLUE SHIELD | Attending: Gynecologic Oncology | Admitting: Gynecologic Oncology

## 2014-07-29 VITALS — BP 117/74 | HR 71 | Temp 97.5°F | Resp 18 | Ht 63.0 in | Wt 202.9 lb

## 2014-07-29 DIAGNOSIS — C541 Malignant neoplasm of endometrium: Secondary | ICD-10-CM | POA: Diagnosis not present

## 2014-07-29 DIAGNOSIS — N8110 Cystocele, unspecified: Secondary | ICD-10-CM

## 2014-07-29 NOTE — Patient Instructions (Signed)
We would like to see your return to our gyn clinic in 1 year. Please call us in November 2015 to schedule your appointment.

## 2014-07-29 NOTE — Progress Notes (Signed)
Consult Note: Gyn-Onc  CC:  Chief Complaint  Patient presents with  . Endometrial cancer    Assessment/Plan:  [name]  is a 64 y.o.  year old with a stage IA grade 1 endometrial carcinoma. She is not require any adjuvant therapy. She and I discussed her followup and that should be seen every 6 months.  She will followup with Dr. Ethelene Hal in 6 months. She'll need a Pap smear at that time. She will return to see [name] in 1 year.   We discussed her vaginal prolapse as well as her cystocele. This time she does not have any symptoms. We discussed referral to urogynecology. However, in the absence of symptoms that is not something she wishes to pursue at this time. She wishes to proceed with her weight loss efforts as well as her Kegel exercises and she will alert me if there's any changes prior to her next visit.  HPI: This is a 5 -year-old gravida 2 para 2 last normal menstrual period approximately 64 years of age. She reports bleeding 5-6 years ago at which time she underwent a uterine curettage and was told that there were fibroids.  When she was manic perimenopausal endometrial biopsy for abnormal bleeding revealed simple hyperplasia. Patient states she did well until 10/19/2012 vaginal spotting for one week. It started but then resumed. An endometrial biopsy was obtained on 11/17/2012 returned with evidence of complex atypical hyperplasia cannot rule out well-differentiated fight FIGO grade 1 endometrial cancer.  Pelvic ultrasound the same day demonstrates a presence of an anteverted uterus. Endometrium was noted to be thickened to 13 mm and there was a was a circumscribed area approximately 1.5 cm with a feeder vessel. An anterior fundal serosal fibroid was noted to measure 1.9 cm bilateral ovaries were seen were unremarkable. Uterus was noted to be 7.06 cm in length.  She denies any vaginal bleeding since endometrial biopsy.  On 12/30/2012 showed a total robotic hysterectomy BSO and right  pelvic lymph node sampling. Operative findings included a normal appearing uterus, adhesions of the omentum to the right pelvic sidewall, rectosigmoid colon adherent to the left pelvic sidewall and left adnexa. Frozen section was consistent with a focus of endometrial cancer, grade 1 with minimal myometrial invasion. She did very well in the hospital and was discharged home on postoperative day #1.  Final pathology revealed:  It superficially invasive endometrioid adenocarcinoma grade 1 arising in a background of atypical complex hyperplasia confined within inner half of the myometrium. The cervix was negative, bilateral adnexa were negative, and zero out of two right pelvic lymph nodes were involved. The myometrial invasion was 0.3 cm with the myometrium was 2.1 cm in thickness. There was no lymphovascular space involvement.  I saw her  for her first surveillance visit 07/22/13.  Interval history: From a gynecologic perspective she's been doing quite well. She has gained approximately 15 pounds since we last saw her. She thinks this is been in combination to decreased exercise as she's been diagnosed with gout as well as her thyroid dose not being adequate. She recently had her thyroid dose adjusted on Monday she has follow-up with her primary physician in about 6 weeks. She was otherwise feeling quite tired. She saw Dr. Ethelene Hal 6 months ago and had a Pap smear done. She did not hear back the results of that and assumes that it was normal. At that time she was told that her vagina was "dropping". She has no sense of pressure she has no  urinary leakage. She has been doing her Kegel exercises. She does complain of some subjective shortness of breath with her weight gain. She does ride her bike about 5 miles per day and she does this 5 days a week. She's been seen by Dr. Hassell Done her rheumatologist. She is currently on allopurinol for gout. 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 Her mammogram is up-to-date. There are no new medical problems and her family.  Review of Systems:  Constitutional: Denies fever, + weight gain Skin: No rash Cardiovascular: No chest pain, + shortness of breath as above, no edema  Pulmonary: No cough  Gastro Intestinal:  No nausea, vomiting, no change in bowel or bladder habits.  Genitourinary: No frequency, urgency, or dysuria.  Denies vaginal bleeding and discharge.  Musculoskeletal: + gout   Current Meds:  Outpatient Encounter Prescriptions as of 07/29/2014  Medication Sig  . allopurinol (ZYLOPRIM) 100 MG tablet   . colchicine 0.6 MG tablet   . levothyroxine (SYNTHROID, LEVOTHROID) 88 MCG tablet Take 88 mcg by mouth daily.  . polyvinyl alcohol (LIQUIFILM TEARS) 1.4 % ophthalmic solution Place 2 drops into both eyes daily as needed (For dry eyes.).  Marland Kitchen propranolol ER (INDERAL LA) 80 MG 24 hr capsule Take 80 mg by mouth at bedtime.   Marland Kitchen aspirin EC 81 MG tablet Take 81 mg by mouth at bedtime.  . Calcium Citrate-Vitamin D (CALCIUM CITRATE + D PO) Take 1 tablet by mouth at bedtime.   . Cholecalciferol (VITAMIN D3) 1000 UNITS CAPS Take 2,000 Units by mouth at bedtime.  . Flaxseed, Linseed, (FLAX SEED OIL PO) Take 1 tablet by mouth at bedtime.   Marland Kitchen levothyroxine (SYNTHROID, LEVOTHROID) 75 MCG tablet Take 88 mcg by mouth daily before breakfast.   . naproxen sodium (ALEVE) 220 MG tablet Take 440 mg by mouth every 8 (eight) hours as needed (For headache or cramping.).    Allergy: No Known Allergies  Social Hx:   History   Social History  . Marital Status: Married    Spouse Name: N/A    Number of Children: N/A  . Years of Education: N/A   Occupational History  . Not on file.   Social History Main Topics  . Smoking status: Never Smoker   . Smokeless tobacco: Never Used  . Alcohol Use: No  . Drug Use: No  . Sexual Activity: Not on file   Other Topics Concern  . Not on file   Social History Narrative    Past Surgical  Hx:  Past Surgical History  Procedure Laterality Date  . Appendectomy    . Cholecystectomy    . Dilation and curettage of uterus    . Robotic assisted total hysterectomy with bilateral salpingo oopherectomy Bilateral 12/30/2012    Procedure: ROBOTIC ASSISTED TOTAL HYSTERECTOMY WITH BILATERAL SALPINGO OOPHORECTOMY,right pelvic lymphnode dissection;  Surgeon: Imagene Gurney A. Alycia Rossetti, MD;  Location: [facility];  Service: Gynecology;  Laterality: Bilateral;    Past Medical Hx:  Past Medical History  Diagnosis Date  . Postmenopausal bleeding   . Osteopenia   . Raynaud disease   . Tachycardia   . Vitamin D deficiency   . Complex endometrial hyperplasia with atypia   . Heart valve problem     h/o leaking valve  . Hypertension   . Hypothyroidism   . GERD (gastroesophageal reflux disease)   . Headache(784.0)     "a light comes for 2min before migraines"  . Arthritis   . Shingles  Oct 26,2014    Back pain started 10/26, Blisters following wednesday    Past Gynecological History: Gravida 2 para 2 menarche occurred at age of 74 and regular menses until the age of 8. Reports use of an IUD for 6 years oral contraceptive pills for over 10 years. Reports a remote history of abnormal Pap test  Family Hx:  Family History  Problem Relation Age of Onset  . Congestive Heart Failure Mother   . Uterine cancer Maternal Aunt      Vitals:  Blood pressure 117/74, pulse 71, temperature 97.5 F (36.4 C), temperature source Oral, resp. rate 18, height 5\' 3"  (1.6 m), weight 202 lb 14.4 oz (92.035 kg), SpO2 99 %.  Physical Exam: WD in NAD  Neck: Supple, no lymphadenopathy no thyromegaly.  Lungs: Clear to auscultation bilaterally.  Cardiovascular: Regular rate and rhythm.  Abdomen: Normoactive bowel sounds, abdomen soft, non-tender and obese. Surgical  sites intact without evidence of hernia.   Groins: No lymphadenopathy.  Extremities: No edema.  Vulva/vagina: Normal external female genitalia.  No  lesions. No discharge or bleeding. Vaginal cuff is visualized, + descent. There are no visible lesions. She has some lateral wall as well as vaginal laxity. She is a grade 1 cystocele. There is no rectocele. Rectal confirms.     Tyronza Happe A., MD 07/29/2014, 1:39 PM
